# Patient Record
Sex: Female | Born: 1980 | Race: White | Hispanic: No | Marital: Married | Smoking: Never smoker
Health system: Southern US, Community
[De-identification: ages and names within clinical notes are randomized; demographics above are authoritative.]

---

## 2021-01-12 ENCOUNTER — Emergency Department (HOSPITAL_COMMUNITY)
Admission: EM | Admit: 2021-01-12 | Discharge: 2021-01-12 | Disposition: A | Discharge status: Home / Self Care | Payer: BC Managed Care – PPO | Attending: Emergency Medicine | Admitting: Emergency Medicine

## 2021-01-12 ENCOUNTER — Other Ambulatory Visit: Payer: Self-pay

## 2021-01-12 ENCOUNTER — Emergency Department (HOSPITAL_COMMUNITY): Payer: BC Managed Care – PPO

## 2021-01-12 ENCOUNTER — Encounter (HOSPITAL_COMMUNITY): Payer: Self-pay | Admitting: Emergency Medicine

## 2021-01-12 DIAGNOSIS — R079 Chest pain, unspecified: Secondary | ICD-10-CM | POA: Diagnosis not present

## 2021-01-12 DIAGNOSIS — R110 Nausea: Secondary | ICD-10-CM | POA: Diagnosis not present

## 2021-01-12 DIAGNOSIS — R0789 Other chest pain: Secondary | ICD-10-CM | POA: Insufficient documentation

## 2021-01-12 LAB — CBC WITH DIFFERENTIAL/PLATELET
Abs Immature Granulocytes: 0.03 10*3/uL (ref 0.00–0.07)
Basophils Absolute: 0 10*3/uL (ref 0.0–0.1)
Basophils Relative: 1 %
Eosinophils Absolute: 0.1 10*3/uL (ref 0.0–0.5)
Eosinophils Relative: 2 %
HCT: 40.8 % (ref 36.0–46.0)
Hemoglobin: 13.6 g/dL (ref 12.0–15.0)
Immature Granulocytes: 1 %
Lymphocytes Relative: 37 %
Lymphs Abs: 2.4 10*3/uL (ref 0.7–4.0)
MCH: 30.3 pg (ref 26.0–34.0)
MCHC: 33.3 g/dL (ref 30.0–36.0)
MCV: 90.9 fL (ref 80.0–100.0)
Monocytes Absolute: 0.6 10*3/uL (ref 0.1–1.0)
Monocytes Relative: 9 %
Neutro Abs: 3.4 10*3/uL (ref 1.7–7.7)
Neutrophils Relative %: 50 %
Platelets: 288 10*3/uL (ref 150–400)
RBC: 4.49 MIL/uL (ref 3.87–5.11)
RDW: 12.3 % (ref 11.5–15.5)
WBC: 6.5 10*3/uL (ref 4.0–10.5)
nRBC: 0 % (ref 0.0–0.2)

## 2021-01-12 LAB — COMPREHENSIVE METABOLIC PANEL
ALT: 20 U/L (ref 0–44)
AST: 19 U/L (ref 15–41)
Albumin: 4.3 g/dL (ref 3.5–5.0)
Alkaline Phosphatase: 44 U/L (ref 38–126)
Anion gap: 9 (ref 5–15)
BUN: 10 mg/dL (ref 6–20)
CO2: 24 mmol/L (ref 22–32)
Calcium: 9.2 mg/dL (ref 8.9–10.3)
Chloride: 104 mmol/L (ref 98–111)
Creatinine, Ser: 0.65 mg/dL (ref 0.44–1.00)
GFR, Estimated: >60 mL/min (ref >60)
Glucose, Bld: 128 mg/dL — ABNORMAL HIGH (ref 70–99)
Potassium: 3.7 mmol/L (ref 3.5–5.1)
Sodium: 137 mmol/L (ref 135–145)
Total Bilirubin: 0.6 mg/dL (ref 0.3–1.2)
Total Protein: 7 g/dL (ref 6.5–8.1)

## 2021-01-12 LAB — I-STAT BETA HCG BLOOD, ED (MC, WL, AP ONLY): I-stat hCG, quantitative: <5 m[IU]/mL (ref <5)

## 2021-01-12 LAB — LIPASE, BLOOD: Lipase: 37 U/L (ref 11–51)

## 2021-01-12 LAB — TROPONIN I (HIGH SENSITIVITY)
Troponin I (High Sensitivity): <2 ng/L (ref <18)
Troponin I (High Sensitivity): <2 ng/L (ref <18)

## 2021-01-12 MED ORDER — ALUM & MAG HYDROXIDE-SIMETH 200-200-20 MG/5ML PO SUSP
30.0000 mL | Freq: Once | ORAL | Status: AC
  Administered 2021-01-12: 30 mL via ORAL
  Filled 2021-01-12: qty 30

## 2021-01-12 MED ORDER — LIDOCAINE VISCOUS HCL 2 % MT SOLN
15.0000 mL | Freq: Once | OROMUCOSAL | Status: AC
  Administered 2021-01-12: 15 mL via ORAL
  Filled 2021-01-12: qty 15

## 2021-01-12 NOTE — ED Provider Notes (Signed)
[facility] EMERGENCY DEPARTMENT Provider Note   CSN: [id number] Arrival date & time: 01/12/21  1502     History Chief Complaint  Patient presents with   Chest Pain    Bonnie Mendoza is a 40 y.o. female.  Patient presents with a chief complaint of chest pain.  She says that this started this afternoon while she was at work.  It is her midsternal area.  Does not radiate.  The pain is intermittent.  It lasts for couple minutes and then goes away.  She states that it feels like it is burning and pressure.  Pain is not associated with exertion.  She says she has had about 5 episodes of this pain.  Never had this pain before.  He is currently asymptomatic She has had some associated nausea but no vomiting. She is currently asymptomatic.  Denies any shortness of breath, vision changes, abdominal pain, numbness or weakness in extremities.  Chest Pain Associated symptoms: nausea   Associated symptoms: no abdominal pain, no back pain, no cough, no dizziness, no fever, no headache, no numbness, no palpitations, no shortness of breath, no vomiting and no weakness       History reviewed. No pertinent past medical history.  There are no problems to display for this patient.   History reviewed. No pertinent surgical history.   OB History   No obstetric history on file.     History reviewed. No pertinent family history.  Social History   Tobacco Use   Smoking status: Never   Smokeless tobacco: Never  Substance Use Topics   Alcohol use: Not Currently   Drug use: Never    Home Medications Prior to Admission medications   Not on File    Allergies    Penicillins  Review of Systems   Review of Systems  Constitutional:  Negative for chills and fever.  HENT:  Negative for congestion, rhinorrhea and sore throat.   Eyes:  Negative for visual disturbance.  Respiratory:  Negative for cough, chest tightness and shortness of breath.   Cardiovascular:  Positive for  chest pain. Negative for palpitations and leg swelling.  Gastrointestinal:  Positive for nausea. Negative for abdominal pain, blood in stool, constipation, diarrhea and vomiting.  Genitourinary:  Negative for dysuria, flank pain and hematuria.  Musculoskeletal:  Negative for back pain.  Skin:  Negative for rash and wound.  Neurological:  Negative for dizziness, syncope, weakness, light-headedness, numbness and headaches.  Psychiatric/Behavioral:  Negative for confusion.   All other systems reviewed and are negative.  Physical Exam Updated Vital Signs BP 98/76   Pulse 74   Temp 98.2 F (36.8 C) (Oral)   Resp 17   Ht 5\' 5"  (1.651 m)   Wt 66.7 kg   LMP 01/01/2021 (Exact Date)   SpO2 100%   BMI 24.46 kg/m   Physical Exam Vitals and nursing note reviewed.  Constitutional:      General: She is not in acute distress.    Appearance: Normal appearance. She is not ill-appearing, toxic-appearing or diaphoretic.  HENT:     Head: Normocephalic and atraumatic.     Nose: No nasal deformity.     Mouth/Throat:     Lips: Pink. No lesions.     Mouth: Mucous membranes are dry. No injury, lacerations, oral lesions or angioedema.     Pharynx: Oropharynx is clear. Uvula midline. No pharyngeal swelling, oropharyngeal exudate, posterior oropharyngeal erythema or uvula swelling.  Eyes:     General: Gaze aligned  appropriately. No scleral icterus.       Right eye: No discharge.        Left eye: No discharge.     Conjunctiva/sclera: Conjunctivae normal.     Right eye: Right conjunctiva is not injected. No exudate or hemorrhage.    Left eye: Left conjunctiva is not injected. No exudate or hemorrhage.    Pupils: Pupils are equal, round, and reactive to light.  Cardiovascular:     Rate and Rhythm: Normal rate and regular rhythm.     Pulses: Normal pulses.          Radial pulses are 2+ on the right side and 2+ on the left side.       Dorsalis pedis pulses are 2+ on the right side and 2+ on the left  side.     Heart sounds: Normal heart sounds, S1 normal and S2 normal. Heart sounds not distant. No murmur heard.   No friction rub. No gallop. No S3 or S4 sounds.     Comments: Pain is not reproducible with palpation of her anterior chest.  Heart sounds with regular rate and rhythm.  No murmurs. Pulmonary:     Effort: Pulmonary effort is normal. No accessory muscle usage or respiratory distress.     Breath sounds: Normal breath sounds. No stridor. No wheezing, rhonchi or rales.  Chest:     Chest wall: No tenderness.  Abdominal:     General: Abdomen is flat. Bowel sounds are normal. There is no distension.     Palpations: Abdomen is soft. There is no mass or pulsatile mass.     Tenderness: There is no abdominal tenderness. There is no guarding or rebound.  Musculoskeletal:     Right lower leg: No edema.     Left lower leg: No edema.  Skin:    General: Skin is warm and dry.     Coloration: Skin is not jaundiced or pale.     Findings: No bruising, erythema, lesion or rash.  Neurological:     General: No focal deficit present.     Mental Status: She is alert and oriented to person, place, and time.     GCS: GCS eye subscore is 4. GCS verbal subscore is 5. GCS motor subscore is 6.  Psychiatric:        Mood and Affect: Mood normal.        Behavior: Behavior normal. Behavior is cooperative.    ED Results / Procedures / Treatments   Labs (all labs ordered are listed, but only abnormal results are displayed) Labs Reviewed  COMPREHENSIVE METABOLIC PANEL - Abnormal; Notable for the following components:      Result Value   Glucose, Bld 128 (*)    All other components within normal limits  CBC WITH DIFFERENTIAL/PLATELET  LIPASE, BLOOD  I-STAT BETA HCG BLOOD, ED ([facility], [facility], [facility] ONLY)  TROPONIN I (HIGH SENSITIVITY)  TROPONIN I (HIGH SENSITIVITY)    EKG None  Radiology DG Chest 2 View  Result Date: 01/12/2021 CLINICAL DATA:  Chest pain, burning sensation for 30 minutes EXAM: CHEST - 2  VIEW COMPARISON:  None. FINDINGS: The heart size and mediastinal contours are within normal limits. Both lungs are clear. The visualized skeletal structures are unremarkable. IMPRESSION: No active cardiopulmonary disease. Electronically Signed   By: Sharlet Salina M.D.   On: 01/12/2021 15:54    Procedures Procedures   Medications Ordered in ED Medications  alum & mag hydroxide-simeth (MAALOX/MYLANTA) 200-200-20 MG/5ML suspension 30 mL (30  mLs Oral Given 01/12/21 1809)    And  lidocaine (XYLOCAINE) 2 % viscous mouth solution 15 mL (15 mLs Oral Given 01/12/21 1809)    ED Course  I have reviewed the triage vital signs and the nursing notes.  Pertinent labs & imaging results that were available during my care of the patient were reviewed by me and considered in my medical decision making (see chart for details).    MDM Rules/Calculators/A&P                         This is a well-appearing 40 year old female with no notable past medical history who presents to the emergency department with 4-5 hours of new onset intermittent chest pains that are not associated with exertion. She is afebrile and vitals are stable. Her exam is completely unremarkable.  She appears to be in no acute distress.  She is currently asymptomatic.  CBC with no leukocytosis or anemia.  CMP with no electrolyte abnormalities or kidney dysfunction.  No metabolic abnormalities.  Pregnancy test negative.  Initial troponin is less than 2. Second troponin negative. CXR with no acute abnormalities EKG is NSR. Added on a lipase. Lipase Negative.   Treated sx with GI cocktail  On reevaluation, patient is feeling better. No episodes of chest pain while in ED. No evidence of ACS or other emergent cause of chest pain found on labs. I have low suspicion for PE so will not do further evaluation. All workup is benign. Patient is to follow up with PCP or [facility] clinic.   I discussed this case with Dr. Stevie Kern who  agrees with the plan outlined above.    Final Clinical Impression(s) / ED Diagnoses Final diagnoses:  Nonspecific chest pain    Rx / DC Orders ED Discharge Orders     None        Therese Sarah 01/12/21 Allegra Grana, MD 01/12/21 2356

## 2021-01-12 NOTE — ED Triage Notes (Signed)
Pt here for cp that started approx 30 min ago, pt reports intermittent central cp w/ shob and nausea, no radiation, no pmh.

## 2021-01-12 NOTE — ED Provider Notes (Signed)
Emergency Medicine Provider Triage Evaluation Note  Bonnie Mendoza , a 40 y.o. female  was evaluated in triage.  Pt complains of chest pain.  Feels like pressure, substernal location.  Does not radiate.  Associated with nausea, no vomiting.  States she felt short of breath when it initially started.  Comes and goes, started acutely 30 minutes ago.  No cardiac history, not on any blood thinners, no recent travel, surgeries not on oral birth control, does not smoke cigarettes, not hypertension, not diabetic.Marland Kitchen  Review of Systems  Positive: Chest pain, nausea, shortness of breath Negative: Vomiting, back pain, arm pain  Physical Exam  There were no vitals taken for this visit. Gen:   Awake, no distress.  Tearful Resp:  Normal effort  MSK:   Moves extremities without difficulty  Other:  Tachycardic, radial pulses equal bilaterally.  Medical Decision Making  Medically screening exam initiated at 3:09 PM.  Appropriate orders placed.  Bonnie Mendoza was informed that the remainder of the evaluation will be completed by another provider, this initial triage assessment does not replace that evaluation, and the importance of remaining in the ED until their evaluation is complete.  Chest pain work-up.   Theron Arista, PA-C 01/12/21 1510    Jacalyn Lefevre, MD 01/12/21 [id number]

## 2021-01-12 NOTE — Discharge Instructions (Addendum)
You were seen in the ED for chest pain. Your work up was not concerning for any emergent cause of chest pain. Please follow up with your PCP or contact [facility] for further evaluation if you continue to experience these pains.

## 2021-01-17 DIAGNOSIS — R079 Chest pain, unspecified: Secondary | ICD-10-CM | POA: Diagnosis not present

## 2021-02-07 DIAGNOSIS — Z131 Encounter for screening for diabetes mellitus: Secondary | ICD-10-CM | POA: Diagnosis not present

## 2021-02-07 DIAGNOSIS — H1013 Acute atopic conjunctivitis, bilateral: Secondary | ICD-10-CM | POA: Diagnosis not present

## 2021-02-07 DIAGNOSIS — R600 Localized edema: Secondary | ICD-10-CM | POA: Diagnosis not present

## 2021-02-07 DIAGNOSIS — Z13220 Encounter for screening for lipoid disorders: Secondary | ICD-10-CM | POA: Diagnosis not present

## 2021-02-07 DIAGNOSIS — Z0000 Encounter for general adult medical examination without abnormal findings: Secondary | ICD-10-CM | POA: Diagnosis not present

## 2021-02-16 DIAGNOSIS — H11151 Pinguecula, right eye: Secondary | ICD-10-CM | POA: Diagnosis not present

## 2021-03-08 ENCOUNTER — Other Ambulatory Visit: Payer: Self-pay

## 2021-03-08 ENCOUNTER — Ambulatory Visit (INDEPENDENT_AMBULATORY_CARE_PROVIDER_SITE_OTHER): Payer: BC Managed Care – PPO | Admitting: Allergy

## 2021-03-08 ENCOUNTER — Encounter: Payer: Self-pay | Admitting: Allergy

## 2021-03-08 VITALS — BP 106/68 | HR 68 | Resp 12 | Ht 64.5 in | Wt 151.0 lb

## 2021-03-08 DIAGNOSIS — T783XXA Angioneurotic edema, initial encounter: Secondary | ICD-10-CM

## 2021-03-08 DIAGNOSIS — J3089 Other allergic rhinitis: Secondary | ICD-10-CM | POA: Diagnosis not present

## 2021-03-08 DIAGNOSIS — T783XXD Angioneurotic edema, subsequent encounter: Secondary | ICD-10-CM

## 2021-03-08 DIAGNOSIS — R600 Localized edema: Secondary | ICD-10-CM | POA: Diagnosis not present

## 2021-03-08 DIAGNOSIS — H5789 Other specified disorders of eye and adnexa: Secondary | ICD-10-CM

## 2021-03-08 NOTE — Progress Notes (Signed)
New Patient Note  RE: Bonnie Mendoza MRN: [id number] DOB: [birth year] Date of Office Visit: 03/08/2021  Consult requested by: Genia Hotter, FNP Primary care provider: Genia Hotter, FNP  Chief Complaint: Facial Swelling (Eye swelling- started 3 years ago )  History of Present Illness: I had the pleasure of seeing Nadia Viar for initial evaluation at the [facility] on 03/08/2021. She is a 40 y.o. female, who is referred here by Stamey, Verda Cumins, FNP for the evaluation of periorbital edema.  Swelling started about 3 years ago. Mainly occurs around her eyes only.  Describes them as she feels white bubbles and within seconds the eyes can get swollen shut. Individual swelling episodes last about 1 week. Associated symptoms include: none.  Denies itching beforehand sometimes it gets itchy afterwards. Frequency of episodes: varies - can occur once a month to every few months. Last episode was in the summer. This occurred overnight and she is not sure if she touched something then rubbed her eyes which caused them to swell. This can occur any time of the year.  She is concerned about this occurring while she's at work or driving her children.   Suspected triggers are unknown. Denies any fevers, chills, changes in medications, foods, personal care products or recent infections.  Patient wears make up and has not changed her make up products. Does not wear contacts.   She has tried the following therapies: eye drops to get the red out with good benefit.  Tried benadryl and pataday.  She has been using the red eye drops more frequently since she is working in person and is self-conscious about her red eyes as some people made comments about them.   Systemic steroids yes. Currently on no daily medications.  Previous work up includes: 01/12/2021 - CBC diff and CMP, slightly elevated glucose Previous history of swelling: no. Family history of angioedema: no. Patient  is up to date with the following cancer screening tests: physical exam, pap smear. Ace-inhibitor use: no  Reviewed images on the phone - significant periorbital edema b/l. No erythema.   Assessment and Plan: Radiance is a 40 y.o. female with: Periorbital edema of both eyes Significant periorbital edema started 3 years ago.  Occurs handful times a year with no specific triggers noted.  Concerned about allergies.  Episodes had a quick onset and last about 1 week.  No significant pruritus associated with this. No family history of angioedema. Today's skin prick testing showed: Positive to dust mites and cockroach Borderline to shellfish. Will get bloodwork instead of intradermal testing. Tolerates shellfish with no issues - okay to consume.  Discussed that I'm not sure the above allergens are causing her symptoms as it's occurring on such a sporadic basis. Keep track of episodes and take pictures.  Get bloodwork as below. If bloodwork is unremarkable will recommend true patch testing next.   Other allergic rhinitis Denies any significant rhinitis symptoms. Today's skin prick testing showed: Positive to dust mites and cockroach Start environmental control measures as below. Use over the counter antihistamines such as Zyrtec (cetirizine), Claritin (loratadine), Allegra (fexofenadine), or Xyzal (levocetirizine) daily as needed.   Eye redness Noted some erythema of her conjunctiva and has been using OTC red eye drops with good benefit. No recent eye exam. Recommend getting eye exam. Use refresh eye drops as needed - try the preservative free ones.  Try to wean off the eye drops that get the red out. Stop using pataday.  Return in about 4 months (around 07/07/2021).  No orders of the defined types were placed in this encounter.  Lab Orders         C1 esterase inhibitor, functional         C1 Esterase Inhibitor         C3 and C4         C-reactive protein         Allergens w/Total IgE Area 2          Tryptase         ANA w/Reflex         Sedimentation rate         Chronic Urticaria         Thyroid Cascade Profile      Other allergy screening: Asthma: no Rhino conjunctivitis: no Food allergy: no Medication allergy: yes Hymenoptera allergy: no Urticaria: no Eczema:no History of recurrent infections suggestive of immunodeficency: no  Diagnostics: Skin Testing: Environmental allergy panel and select foods. Positive to dust mites and cockroach Borderline to shellfish - okay to continue to eat.  Results discussed with patient/family.  Airborne Adult Perc - 03/08/21 1049     Time Antigen Placed 1049    Allergen Manufacturer Lavella Hammock    Location Back    Number of Test 59    1. Control-Buffer 50% Glycerol Negative    2. Control-Histamine 1 mg/ml 2+    3. Albumin saline Negative    4. [place] Negative    5. [place] Negative    6. Johnson Negative    7. [place] Blue Negative    8. Meadow Fescue Negative    9. Perennial Rye Negative    10. Sweet Vernal Negative    11. Timothy Negative    12. Cocklebur Negative    13. Burweed Marshelder Negative    14. Ragweed, short Negative    15. Ragweed, Giant Negative    16. Plantain,  English Negative    17. Lamb's Quarters Negative    18. Sheep Sorrell Negative    19. Rough Pigweed Negative    20. Marsh Elder, Rough Negative    21. Mugwort, Common Negative    22. Ash mix Negative    23. Birch mix Negative    24. Beech American Negative    25. Box, Elder Negative    26. Cedar, red Negative    27. Cottonwood, [place] Negative    28. Elm mix Negative    29. Hickory Negative    30. Maple mix Negative    31. Oak, [place] mix Negative    32. Pecan Pollen Negative    33. Pine mix Negative    34. Sycamore Eastern Negative    35. Fair Oaks, Black Pollen Negative    36. Alternaria alternata Negative    37. Cladosporium Herbarum Negative    38. Aspergillus mix Negative    39. Penicillium mix Negative    40. Bipolaris  sorokiniana (Helminthosporium) Negative    41. Drechslera spicifera (Curvularia) Negative    42. Mucor plumbeus Negative    43. Fusarium moniliforme Negative    44. Aureobasidium pullulans (pullulara) Negative    45. Rhizopus oryzae Negative    46. Botrytis cinera Negative    47. Epicoccum nigrum Negative    48. Phoma betae Negative    49. Candida Albicans Negative    50. Trichophyton mentagrophytes Negative    51. Mite, D Farinae  5,000 AU/ml 2+    52. Mite, D Pteronyssinus  5,000  AU/ml Negative    53. Cat Hair 10,000 BAU/ml Negative    54.  Dog Epithelia Negative    55. Mixed Feathers Negative    56. Horse Epithelia Negative    57. Cockroach, German 2+    58. Mouse Negative    59. Tobacco Leaf Negative             Food Perc - 03/08/21 1049       Test Information   Time Antigen Placed 1049    Allergen Manufacturer Lavella Hammock    Location Back    Number of allergen test 10      Food   1. Peanut Negative    2. Soybean food Negative    3. Wheat, whole Negative    4. Sesame Negative    5. Milk, cow Negative    6. Egg White, chicken Negative    7. Casein Negative    8. Shellfish mix --   +/-   9. Fish mix Negative    10. Cashew Negative             Past Medical History: Patient Active Problem List   Diagnosis Date Noted   Periorbital edema of both eyes 03/08/2021   Angio-edema 03/08/2021   Eye redness 03/08/2021   Other allergic rhinitis 03/08/2021    History reviewed. No pertinent past medical history. Past Surgical History: History reviewed. No pertinent surgical history. Medication List:  Current Outpatient Medications  Medication Sig Dispense Refill   diphenhydrAMINE (BENADRYL) 25 mg capsule Take 25 mg by mouth every 6 (six) hours as needed.     tobramycin-dexamethasone (TOBRADEX) ophthalmic ointment 1 application 3 (three) times daily.     tobramycin-dexamethasone (TOBRADEX) ophthalmic solution every 4 (four) hours while awake.     No current  facility-administered medications for this visit.   Allergies: Allergies  Allergen Reactions   Penicillins Anaphylaxis   Social History: Social History   Socioeconomic History   Marital status: Married    Spouse name: Not on file   Number of children: Not on file   Years of education: Not on file   Highest education level: Not on file  Occupational History   Not on file  Tobacco Use   Smoking status: Never   Smokeless tobacco: Never  Vaping Use   Vaping Use: Never used  Substance and Sexual Activity   Alcohol use: Not Currently   Drug use: Never   Sexual activity: Not on file  Other Topics Concern   Not on file  Social History Narrative   Not on file   Social Determinants of Health   Financial Resource Strain: Not on file  Food Insecurity: Not on file  Transportation Needs: Not on file  Physical Activity: Not on file  Stress: Not on file  Social Connections: Not on file   Lives in a house. Smoking: denies Occupation: Microbiologist HistoryFreight forwarder in the house: no Charity fundraiser in the family room: no Carpet in the bedroom: no Heating: gas Cooling: central Pet: no  Family History: Family History  Problem Relation Age of Onset   Allergic rhinitis Father    Eczema Daughter    Eczema Son    Eczema Son    Asthma Neg Hx    Immunodeficiency Neg Hx    Atopy Neg Hx    Urticaria Neg Hx    Angioedema Neg Hx    Review of Systems  Constitutional:  Negative for appetite change, chills, fever and  unexpected weight change.  HENT:  Negative for congestion and rhinorrhea.   Eyes:  Positive for redness. Negative for itching.  Respiratory:  Negative for cough, chest tightness, shortness of breath and wheezing.   Cardiovascular:  Negative for chest pain.  Gastrointestinal:  Negative for abdominal pain.  Genitourinary:  Negative for difficulty urinating.  Skin:  Negative for rash.  Allergic/Immunologic: Positive for  environmental allergies.  Neurological:  Negative for headaches.   Objective: BP 106/68   Pulse 68   Resp 12   Ht 5' 4.5" (1.638 m)   Wt 151 lb (68.5 kg)   SpO2 98%   BMI 25.52 kg/m  Body mass index is 25.52 kg/m. Physical Exam Vitals and nursing note reviewed.  Constitutional:      Appearance: Normal appearance. She is well-developed.  HENT:     Head: Normocephalic and atraumatic.     Right Ear: Tympanic membrane and external ear normal.     Left Ear: Tympanic membrane and external ear normal.     Nose: Nose normal.     Mouth/Throat:     Mouth: Mucous membranes are moist.     Pharynx: Oropharynx is clear.  Eyes:     Comments: Slightly erythematous conjunctiva b/l.  Cardiovascular:     Rate and Rhythm: Normal rate and regular rhythm.     Heart sounds: Normal heart sounds. No murmur heard.   No friction rub. No gallop.  Pulmonary:     Effort: Pulmonary effort is normal.     Breath sounds: Normal breath sounds. No wheezing, rhonchi or rales.  Musculoskeletal:     Cervical back: Neck supple.  Skin:    General: Skin is warm.     Findings: No rash.  Neurological:     Mental Status: She is alert and oriented to person, place, and time.  Psychiatric:        Behavior: Behavior normal.  The plan was reviewed with the patient/family, and all questions/concerned were addressed.  It was my pleasure to see Fallon today and participate in her care. Please feel free to contact me with any questions or concerns.  Sincerely,  Rexene Alberts, DO Allergy & Immunology  [facility] office: [place] office: [phone]

## 2021-03-08 NOTE — Assessment & Plan Note (Signed)
Denies any significant rhinitis symptoms.  Today's skin prick testing showed: Positive to dust mites and cockroach  Start environmental control measures as below.  Use over the counter antihistamines such as Zyrtec (cetirizine), Claritin (loratadine), Allegra (fexofenadine), or Xyzal (levocetirizine) daily as needed.

## 2021-03-08 NOTE — Assessment & Plan Note (Signed)
Significant periorbital edema started 3 years ago.  Occurs handful times a year with no specific triggers noted.  Concerned about allergies.  Episodes had a quick onset and last about 1 week.  No significant pruritus associated with this. No family history of angioedema.  Today's skin prick testing showed: Positive to dust mites and cockroach Borderline to shellfish.  Will get bloodwork instead of intradermal testing.  Tolerates shellfish with no issues - okay to consume.   Discussed that I'm not sure the above allergens are causing her symptoms as it's occurring on such a sporadic basis. Bonnie Mendoza Keep track of episodes and take pictures.  . Get bloodwork as below. o If bloodwork is unremarkable will recommend true patch testing next.

## 2021-03-08 NOTE — Assessment & Plan Note (Signed)
Noted some erythema of her conjunctiva and has been using OTC red eye drops with good benefit. No recent eye exam. . Recommend getting eye exam. . Use refresh eye drops as needed - try the preservative free ones.  . Try to wean off the eye drops that get the red out. . Stop using pataday.

## 2021-03-08 NOTE — Patient Instructions (Addendum)
Today's skin testing showed: Positive to dust mites and cockroach Borderline to shellfish - okay to continue to eat.  Results given.   Swelling: Keep track of episodes and take pictures.  Get bloodwork We are ordering labs, so please allow 1-2 weeks for the results to come back. With the newly implemented Cures Act, the labs might be visible to you at the same time that they become visible to me. However, I will not address the results until all of the results are back, so please be patient.  In the meantime, continue recommendations in your patient instructions, including avoidance measures (if applicable), until you hear from me.  Red eyes: Recommend getting your eyes checked.  Use refresh eye drops as needed - try the preservative free ones.  Try to wean off the eye drops that get the red out. Stop using pataday.   Environmental allergies Start environmental control measures as below. Use over the counter antihistamines such as Zyrtec (cetirizine), Claritin (loratadine), Allegra (fexofenadine), or Xyzal (levocetirizine) daily as needed.   Consider patch testing in future if bloodwork is negative.  Patches are best placed on Monday with return to office on Wednesday and Friday of same week for readings.  Patches once placed should not get wet.  You do not have to stop any medications for patch testing but should not be on oral prednisone. You can schedule a patch testing visit when convenient for your schedule.    Follow up in 4 months or sooner if needed.    Control of House Dust Mite Allergen Dust mite allergens are a common trigger of allergy and asthma symptoms. While they can be found throughout the house, these microscopic creatures thrive in warm, humid environments such as bedding, upholstered furniture and carpeting. Because so much time is spent in the bedroom, it is essential to reduce mite levels there.  Encase pillows, mattresses, and box springs in special allergen-proof  fabric covers or airtight, zippered plastic covers.  Bedding should be washed weekly in hot water (130 F) and dried in a hot dryer. Allergen-proof covers are available for comforters and pillows that can't be regularly washed.  Wash the allergy-proof covers every few months. Minimize clutter in the bedroom. Keep pets out of the bedroom.  Keep humidity less than 50% by using a dehumidifier or air conditioning. You can buy a humidity measuring device called a hygrometer to monitor this.  If possible, replace carpets with hardwood, linoleum, or washable area rugs. If that's not possible, vacuum frequently with a vacuum that has a HEPA filter. Remove all upholstered furniture and non-washable window drapes from the bedroom. Remove all non-washable stuffed toys from the bedroom.  Wash stuffed toys weekly. Cockroach Allergen Avoidance Cockroaches are often found in the homes of densely populated urban areas, schools or commercial buildings, but these creatures can lurk almost anywhere. This does not mean that you have a dirty house or living area. Block all areas where roaches can enter the home. This includes crevices, wall cracks and windows.  Cockroaches need water to survive, so fix and seal all leaky faucets and pipes. Have an exterminator go through the house when your family and pets are gone to eliminate any remaining roaches. Keep food in lidded containers and put pet food dishes away after your pets are done eating. Vacuum and sweep the floor after meals, and take out garbage and recyclables. Use lidded garbage containers in the kitchen. Wash dishes immediately after use and clean under stoves, refrigerators or  toasters where crumbs can accumulate. Wipe off the stove and other kitchen surfaces and cupboards regularly.

## 2021-03-16 LAB — C1 ESTERASE INHIBITOR, FUNCTIONAL: C1INH Functional/C1INH Total MFr SerPl: 82 %mean normal

## 2021-03-16 LAB — ALLERGENS W/TOTAL IGE AREA 2
Alternaria Alternata IgE: <0.1 kU/L
Aspergillus Fumigatus IgE: <0.1 kU/L
Bermuda Grass IgE: <0.1 kU/L
Cat Dander IgE: <0.1 kU/L
Cedar, Mountain IgE: <0.1 kU/L
Cladosporium Herbarum IgE: <0.1 kU/L
Cockroach, German IgE: 1.37 kU/L — AB
Common Silver Birch IgE: <0.1 kU/L
Cottonwood IgE: <0.1 kU/L
D Farinae IgE: <0.1 kU/L
D Pteronyssinus IgE: <0.1 kU/L
Dog Dander IgE: <0.1 kU/L
Elm, American IgE: <0.1 kU/L
IgE (Immunoglobulin E), Serum: 112 IU/mL (ref 6–495)
Johnson Grass IgE: <0.1 kU/L
Maple/Box Elder IgE: <0.1 kU/L
Mouse Urine IgE: <0.1 kU/L
Oak, White IgE: <0.1 kU/L
Pecan, Hickory IgE: <0.1 kU/L
Penicillium Chrysogen IgE: <0.1 kU/L
Pigweed, Rough IgE: <0.1 kU/L
Ragweed, Short IgE: 0.28 kU/L — AB
Sheep Sorrel IgE Qn: <0.1 kU/L
Timothy Grass IgE: <0.1 kU/L
White Mulberry IgE: <0.1 kU/L

## 2021-03-16 LAB — C3 AND C4
Complement C3, Serum: 104 mg/dL (ref 82–167)
Complement C4, Serum: 21 mg/dL (ref 12–38)

## 2021-03-16 LAB — CHRONIC URTICARIA: cu index: <3.2 (ref <10)

## 2021-03-16 LAB — ANA W/REFLEX: Anti Nuclear Antibody (ANA): NEGATIVE

## 2021-03-16 LAB — C1 ESTERASE INHIBITOR: C1INH SerPl-mCnc: 28 mg/dL (ref 21–39)

## 2021-03-16 LAB — C-REACTIVE PROTEIN: CRP: <1 mg/L (ref 0–10)

## 2021-03-16 LAB — TRYPTASE: Tryptase: <1 ug/L — ABNORMAL LOW (ref 2.2–13.2)

## 2021-03-16 LAB — SEDIMENTATION RATE: Sed Rate: 2 mm/hr (ref 0–32)

## 2021-03-16 LAB — THYROID CASCADE PROFILE: TSH: 2.09 u[IU]/mL (ref 0.450–4.500)

## 2021-04-11 ENCOUNTER — Other Ambulatory Visit: Payer: Self-pay

## 2021-04-11 ENCOUNTER — Ambulatory Visit (INDEPENDENT_AMBULATORY_CARE_PROVIDER_SITE_OTHER): Payer: BC Managed Care – PPO | Admitting: Internal Medicine

## 2021-04-11 DIAGNOSIS — L235 Allergic contact dermatitis due to other chemical products: Secondary | ICD-10-CM | POA: Diagnosis not present

## 2021-04-11 DIAGNOSIS — J3089 Other allergic rhinitis: Secondary | ICD-10-CM

## 2021-04-11 DIAGNOSIS — R600 Localized edema: Secondary | ICD-10-CM

## 2021-04-11 NOTE — Progress Notes (Signed)
FOLLOW UP Date of Service/Encounter:  04/11/21   Subjective:  Bonnie Mendoza (DOB: 13-Feb-1981) is a 41 y.o. female who returns to the [facility] on 04/11/2021 in re-evaluation of the following: Periorbital edema and patch test placement History obtained from: chart review and patient.  For Review, LV was on 03-08-2021 with Dr. Maudie Mercury seen for periorbital edema.    Today is a  follow-up for patch test placement .  At initial visit patient described 3-year history of periorbital edema associated with "white bubbles which appear in her skin .  "No clear triggers are identified.  Frequency was variable.  Previous treatments include eyedrops for conjunctival hyperemia with good benefit.  She had also tried Benadryl and Pataday.  Previous work up includes: 01/12/2021 - CBC diff and CMP, slightly elevated glucose, 03-08-2021-tryptase, ANA, CRP, ESR, chronic urticaria index, C3-C4, C1 esterase inhibitor level and function were all normal Previous history of swelling: no. Family history of angioedema: no. Patient is up to date with the following cancer screening tests: physical exam, pap smear. Ace-inhibitor use: no Skin prick testing: Positive to dust mite and roach, borderline to shellfish.  She tolerates shellfish with no issues and was instructed to continue consumption.  She reports today noticing association with symptoms and use of any product containing Vaseline.  This exposure does not account for 100% of flares but a majority.  Reviewed protocol for patch testing to include avoidance of showering and need to return on a Wednesday and Friday for delayed reads.  Reviewed risk of sensitization through patch testing and recurrence of original dermatitis.  Allergies as of 04/11/2021       Reactions   Penicillins Anaphylaxis        Medication List        Accurate as of April 11, 2021  8:41 AM. If you have any questions, ask your nurse or doctor.          diphenhydrAMINE  25 mg capsule Commonly known as: BENADRYL Take 25 mg by mouth every 6 (six) hours as needed.   tobramycin-dexamethasone ophthalmic ointment Commonly known as: TOBRADEX 1 application 3 (three) times daily.   tobramycin-dexamethasone ophthalmic solution Commonly known as: TOBRADEX every 4 (four) hours while awake.       No past medical history on file. No past surgical history on file. Otherwise, there have been no changes to her past medical history, surgical history, family history, or social history.  ROS: All others negative except as noted per HPI.   Objective:  There were no vitals taken for this visit. There is no height or weight on file to calculate BMI. Physical Exam: General Appearance:  Alert, cooperative, no distress, appears stated age  Head:  Normocephalic, without obvious abnormality, atraumatic  HEENT  Conjunctiva clear, no dermatitis noted on eyelids         Neck: Supple, symmetrical  Lungs:   Respirations unlabored, no coughing,   Heart:  Appears well perfused,   Extremities: No edema  Skin: Skin color, texture, turgor normal, no rashes or lesions on visualized portions of skin  Neurologic: No gross deficits   Reviewed: Previous allergy encounters, testing, PFTS, allergy pertinent laboratory and radiographic data   I reviewed her past medical history, social history, family history, and environmental history and no significant changes have been reported from her previous visit.   Assessment:  Periorbital edema of both eyes  Other allergic rhinitis  Plan/Recommendations:   Patient Instructions  Periorbital edema of both  eyes True Test (36 allergens) placed today.  Patient to follow-up on Wednesday for initial read.  Instructed to avoid rubbing, showering or disrupting test.  Other allergic rhinitis Prior Skin Testing in 2022: Positive to dust mites and cockroach Continue environmental control measures as below. Continue Use over the counter  antihistamines such as Zyrtec (cetirizine), Claritin (loratadine), Allegra (fexofenadine), or Xyzal (levocetirizine) daily as needed.   Follow up: Wednesday 04/13/21 for initial patch test read   Thank you so much for letting me partake in your care today.  Don't hesitate to reach out if you have any additional concerns!  Roney Marion, MD  [facility] [place] [place]

## 2021-04-11 NOTE — Patient Instructions (Addendum)
Periorbital edema of both eyes True Test (36 allergens) placed today.  Patient to follow-up on Wednesday for initial read.  Instructed to avoid rubbing, showering or disrupting test.  Other allergic rhinitis Prior Skin Testing in 2022: Positive to dust mites and cockroach Continue environmental control measures as below. Continue Use over the counter antihistamines such as Zyrtec (cetirizine), Claritin (loratadine), Allegra (fexofenadine), or Xyzal (levocetirizine) daily as needed.   Follow up: Wednesday 04/13/21 for initial patch test read   Thank you so much for letting me partake in your care today.  Don't hesitate to reach out if you have any additional concerns!  Ferol Luz, MD  [facility] [place] [place]

## 2021-04-13 ENCOUNTER — Encounter: Payer: Self-pay | Admitting: Family Medicine

## 2021-04-13 ENCOUNTER — Other Ambulatory Visit: Payer: Self-pay

## 2021-04-13 ENCOUNTER — Ambulatory Visit: Payer: BC Managed Care – PPO | Admitting: Family Medicine

## 2021-04-13 DIAGNOSIS — L239 Allergic contact dermatitis, unspecified cause: Secondary | ICD-10-CM | POA: Insufficient documentation

## 2021-04-13 DIAGNOSIS — L235 Allergic contact dermatitis due to other chemical products: Secondary | ICD-10-CM

## 2021-04-13 NOTE — Progress Notes (Signed)
° ° °  Follow-up Note  RE: Bonnie Mendoza MRN: [id number] DOB: Nov 17, 1980 Date of Office Visit: 04/13/2021  Primary care provider: Scarlett Presto Verda Cumins, FNP Referring provider: Stamey, Verda Cumins, FNP   Bonnie Mendoza returns to the office today for the initial patch test interpretation, given suspected history of contact dermatitis.    Diagnostics:   TRUE TEST 48-hour hour reading:  possible reaction to #10 (Balsam of [place]), positive reaction to #23 (Thiomersal), possible reaction to #28 (Gold sodium thiosulfate), and positive reaction to #34 (Parthenolide)   Plan:   Allergic contact dermatitis - The patient has been provided detailed information regarding the substances she is sensitive to, as well as products containing the substances.   - Meticulous avoidance of these substances is recommended.  - If avoidance is not possible, the use of barrier creams or lotions is recommended. - If symptoms persist or progress despite meticulous avoidance of the substances as listed above, a dermatology referral may be warranted.  Thank you for the opportunity to care for this patient.  Please do not hesitate to contact me with questions.  Thermon Leyland, FNP [facility]

## 2021-04-13 NOTE — Patient Instructions (Signed)
Allergic contact dermatitis - The patient has been provided detailed information regarding the substances she is sensitive to, as well as products containing the substances.   - Meticulous avoidance of these substances is recommended.  - If avoidance is not possible, the use of barrier creams or lotions is recommended. - If symptoms persist or progress despite meticulous avoidance of the substances as listed below, a dermatology referral may be warranted.  Diagnostics:   TRUE TEST 48-hour hour reading: possible reaction to #10 (Balsam of [place]), positive reaction to #23 (Thiomersal), possible reaction to #28 (Gold sodium thiosulfate), and positive reaction to #34 (Parthenolide)  Call the clinic if this treatment plan is not working well for you  Follow up in 2 days or sooner if needed.

## 2021-04-15 ENCOUNTER — Encounter: Payer: BC Managed Care – PPO | Admitting: Family Medicine

## 2021-04-15 ENCOUNTER — Ambulatory Visit: Payer: BC Managed Care – PPO | Admitting: Family

## 2021-04-15 ENCOUNTER — Encounter: Payer: Self-pay | Admitting: Family

## 2021-04-15 ENCOUNTER — Other Ambulatory Visit: Payer: Self-pay

## 2021-04-15 VITALS — Temp 98.0°F

## 2021-04-15 DIAGNOSIS — L235 Allergic contact dermatitis due to other chemical products: Secondary | ICD-10-CM

## 2021-04-15 MED ORDER — CETIRIZINE HCL 10 MG PO TABS: 10.0000 mg | ORAL_TABLET | Freq: Every day | ORAL | 3 refills | Status: DC

## 2021-04-15 NOTE — Progress Notes (Signed)
Bonnie Mendoza returns to the office today for the final patch test interpretation, given suspected history of contact dermatitis. She reports that since the patch was placed on Monday she has had itchy areas that come and go above her eye and her upper eyelid was swollen earlier this morning. She does not take an antihistamine and reports this is different from her previous swelling that involved swelling around both her eyes. Her lab work on 03/08/21 for thyroid, autoimmune screener, inflammation markers, chronic urticaria index, tryptase, and angioedema panel were all normal.   Diagnostics:   TRUE TEST 96-hour hour reading:  (++) strong positive reaction to Nickel Sulfate, (+) weak positive reaction to Thimerosal, and (+) weak positive reaction to Parthenolide  Plan:   Allergic contact dermatitis - The patient has been provided detailed information regarding the substances she is sensitive to, as well as products containing the substances.   - Meticulous avoidance of these substances is recommended.  - If avoidance is not possible, the use of barrier creams or lotions is recommended. - If symptoms persist or progress despite meticulous avoidance of Nickel Sulfate, Balsam of [place], Thimerosal, Gold Sodium Thiosulfate, and Parthenolide, a Dermatology Referral may be warranted.  Nehemiah Settle, FNP [facility]

## 2021-04-15 NOTE — Patient Instructions (Addendum)
Allergic contact dermatitis - The patient has been provided detailed information regarding the substances she is sensitive to, as well as products containing the substances.   - Meticulous avoidance of these substances is recommended.  - If avoidance is not possible, the use of barrier creams or lotions is recommended. - If symptoms persist or progress despite meticulous avoidance of Nickel Sulfate,Balsam of [place], Thimerosal, Gold Sodium Thiosulfate, and Parthenolide a Dermatology Referral may be warranted. - May use Zyrtec (cetirizine) 10 mg once a day as needed for itching  Keep your already scheduled follow up appointment with Dr. Selena Batten on 07/07/21 @ 3:45 PM

## 2021-04-20 DIAGNOSIS — H11433 Conjunctival hyperemia, bilateral: Secondary | ICD-10-CM | POA: Diagnosis not present

## 2021-04-20 DIAGNOSIS — H1045 Other chronic allergic conjunctivitis: Secondary | ICD-10-CM | POA: Diagnosis not present

## 2021-04-20 DIAGNOSIS — H02882 Meibomian gland dysfunction right lower eyelid: Secondary | ICD-10-CM | POA: Diagnosis not present

## 2021-04-20 DIAGNOSIS — H02885 Meibomian gland dysfunction left lower eyelid: Secondary | ICD-10-CM | POA: Diagnosis not present

## 2021-05-06 DIAGNOSIS — H02882 Meibomian gland dysfunction right lower eyelid: Secondary | ICD-10-CM | POA: Diagnosis not present

## 2021-05-06 DIAGNOSIS — H02885 Meibomian gland dysfunction left lower eyelid: Secondary | ICD-10-CM | POA: Diagnosis not present

## 2021-07-06 NOTE — Progress Notes (Signed)
? ?Follow Up Note ? ?RE: Bonnie Mendoza MRN: [id number] DOB: [birth year] ?Date of Office Visit: 07/07/2021 ? ?Referring provider: Stamey, Verda Cumins, FNP ?Primary care provider: Stamey, Verda Cumins, FNP ? ?Chief Complaint: Allergic Rhinitis  ? ?History of Present Illness: ?I had the pleasure of seeing Bonnie Mendoza for a follow up visit at the [facility] on 07/07/2021. She is a 41 y.o. female, who is being followed for contact dermatitis and allergic rhinitis. Her previous allergy office visit was on 04/15/2021 with Nehemiah Settle FNP. Today is a regular follow up visit. ? ? Eye redness ?Saw the eye doctor and had normal eye exam and no dry eyes. ? ?Patient uses refresh eye drops  ?She did use steroid eye drops for a few weeks which actually worsened the redness. ? ?Uses lumify eye drops as needed - usually at work before meetings due to the extensive red eyes. It helps for a few hours.  ? ?She tried not to use anything over the weekend but it still gets red. ? ?Got glasses for blue light.  ? ?Allergic contact dermatitis ?Patient has not received a pdf of the safe list and she has been trying to avoid things that were positive on the true patch. ? ?Periorbital edema of both eyes ?No swelling of the eyes anymore.   ? ?Other allergic rhinitis ?Taking zyrtec 10mg  daily but not sure if effective.  ? ?Assessment and Plan: ?Bonnie Mendoza is a 41 y.o. female with: ?Eye redness ?Past history - Noted some erythema of her conjunctiva and has been using OTC red eye drops with good benefit.  ?Interim history - eye exam unremarkable. Tried steroid eye drops which made it worse. Using lumify eye drops wit good benefit.  ?Discussed with patient that I'm not sure if her environmental allergies are contributing to her red eyes or not.  ?Stop zyrtec as ineffective. ?Concerning if patient is using personal care products and eye drops that are not on the safe list - will e-mail new safe list to patient. ?Limit eye drop use - most  likely having some rebound symptoms. ?If no improvement, then will discuss allergy shots for environmental allergies.  ? ?Allergic contact dermatitis ?Past history - 2023 True patch test was positive to Balsam of 2024, Gold Sodium Thiosulfate, Nickel Sulfate, Parthenolide, Thimerosal ?Interim history - not really avoiding these items. ?Advised patient to look at safe list and only use personal care products from the safe list - pay attention to the eye drop section.  ? ?Perennial allergic rhinitis ?Past history - 2022 skin prick testing showed: Positive to dust mites and cockroach. 2022 bloodwork positive to cockroach and ragweed. ?Interim history - zyrtec ineffective.  ?Continue environmental control measures.  ?No symptoms besides the red eyes. ? ?Periorbital edema of both eyes ?Past history - Significant periorbital edema started 3 years ago.  Occurs handful times a year with no specific triggers noted.  Concerned about allergies.  Episodes had a quick onset and last about 1 week.  No significant pruritus associated with this. No family history of angioedema. ?Interim history - no additional episodes. Bloodwork unremarkable. ?Monitor symptoms.  ? ?Return in about 3 months (around 10/06/2021). ? ?No orders of the defined types were placed in this encounter. ? ?Lab Orders  ?No laboratory test(s) ordered today  ? ?Diagnostics: ?None.  ? ?Medication List:  ?No current outpatient medications on file.  ? ?No current facility-administered medications for this visit.  ? ?Allergies: ?Allergies  ?Allergen Reactions  ?  Penicillins Anaphylaxis  ? ?I reviewed her past medical history, social history, family history, and environmental history and no significant changes have been reported from her previous visit. ? ?Review of Systems  ?Constitutional:  Negative for appetite change, chills, fever and unexpected weight change.  ?HENT:  Negative for congestion and rhinorrhea.   ?Eyes:  Positive for redness. Negative for itching.   ?Respiratory:  Negative for cough, chest tightness, shortness of breath and wheezing.   ?Cardiovascular:  Negative for chest pain.  ?Gastrointestinal:  Negative for abdominal pain.  ?Genitourinary:  Negative for difficulty urinating.  ?Skin:  Negative for rash.  ?Allergic/Immunologic: Positive for environmental allergies.  ?Neurological:  Negative for headaches.  ? ?Objective: ?BP 100/66   Pulse 74   Temp 98.5 ?F (36.9 ?C) (Temporal)   Resp 16   SpO2 98%  ?There is no height or weight on file to calculate BMI. ?Physical Exam ?Vitals and nursing note reviewed.  ?Constitutional:   ?   Appearance: Normal appearance. She is well-developed.  ?HENT:  ?   Head: Normocephalic and atraumatic.  ?   Right Ear: Tympanic membrane and external ear normal.  ?   Left Ear: Tympanic membrane and external ear normal.  ?   Nose: Nose normal.  ?   Mouth/Throat:  ?   Mouth: Mucous membranes are moist.  ?   Pharynx: Oropharynx is clear.  ?Eyes:  ?   Comments: Slightly erythematous conjunctiva b/l.  ?Cardiovascular:  ?   Rate and Rhythm: Normal rate and regular rhythm.  ?   Heart sounds: Normal heart sounds. No murmur heard. ?  No friction rub. No gallop.  ?Pulmonary:  ?   Effort: Pulmonary effort is normal.  ?   Breath sounds: Normal breath sounds. No wheezing, rhonchi or rales.  ?Musculoskeletal:  ?   Cervical back: Neck supple.  ?Skin: ?   General: Skin is warm.  ?   Findings: No rash.  ?Neurological:  ?   Mental Status: She is alert and oriented to person, place, and time.  ?Psychiatric:     ?   Behavior: Behavior normal.  ? ?Previous notes and tests were reviewed. ?The plan was reviewed with the patient/family, and all questions/concerned were addressed. ? ?It was my pleasure to see Bonnie Mendoza today and participate in her care. Please feel free to contact me with any questions or concerns. ? ?Sincerely, ? ?Wyline Mood, DO ?Allergy & Immunology ? ?[facility] ?[place] office: [phone] ?[place]  office: [phone] ?

## 2021-07-07 ENCOUNTER — Ambulatory Visit (INDEPENDENT_AMBULATORY_CARE_PROVIDER_SITE_OTHER): Payer: BC Managed Care – PPO | Admitting: Allergy

## 2021-07-07 ENCOUNTER — Encounter: Payer: Self-pay | Admitting: Allergy

## 2021-07-07 VITALS — BP 100/66 | HR 74 | Temp 98.5°F | Resp 16

## 2021-07-07 DIAGNOSIS — J3089 Other allergic rhinitis: Secondary | ICD-10-CM | POA: Diagnosis not present

## 2021-07-07 DIAGNOSIS — L239 Allergic contact dermatitis, unspecified cause: Secondary | ICD-10-CM

## 2021-07-07 DIAGNOSIS — R600 Localized edema: Secondary | ICD-10-CM

## 2021-07-07 DIAGNOSIS — H5789 Other specified disorders of eye and adnexa: Secondary | ICD-10-CM

## 2021-07-07 NOTE — Patient Instructions (Addendum)
?  Make sure to only use things from the safe list - I will send the pdf later today. ?Limit eye drop use. ?Stop zyrtec as ineffective. ?If no improvement, then will discuss allergy shots for environmental allergies.  ? ?Follow up in 3 months or sooner if needed.  ? ? ?

## 2021-07-07 NOTE — Assessment & Plan Note (Signed)
Past history - 2023 True patch test was positive to Balsam of [place], Gold Sodium Thiosulfate, Nickel Sulfate, Parthenolide, Thimerosal ?Interim history - not really avoiding these items. ?? Advised patient to look at safe list and only use personal care products from the safe list - pay attention to the eye drop section.  ?

## 2021-07-07 NOTE — Assessment & Plan Note (Signed)
Past history - 2022 skin prick testing showed: Positive to dust mites and cockroach. 2022 bloodwork positive to cockroach and ragweed. ?Interim history - zyrtec ineffective.  ?? Continue environmental control measures.  ?? No symptoms besides the red eyes. ?

## 2021-07-07 NOTE — Assessment & Plan Note (Signed)
Past history - Noted some erythema of her conjunctiva and has been using OTC red eye drops with good benefit.  ?Interim history - eye exam unremarkable. Tried steroid eye drops which made it worse. Using lumify eye drops wit good benefit.  ?? Discussed with patient that I'm not sure if her environmental allergies are contributing to her red eyes or not.  ?? Stop zyrtec as ineffective. ?? Concerning if patient is using personal care products and eye drops that are not on the safe list - will e-mail new safe list to patient. ?? Limit eye drop use - most likely having some rebound symptoms. ?? If no improvement, then will discuss allergy shots for environmental allergies.  ?

## 2021-07-07 NOTE — Assessment & Plan Note (Addendum)
Past history - Significant periorbital edema started 3 years ago.  Occurs handful times a year with no specific triggers noted.  Concerned about allergies.  Episodes had a quick onset and last about 1 week.  No significant pruritus associated with this. No family history of angioedema. ?Interim history - no additional episodes. Bloodwork unremarkable. ?? Monitor symptoms.  ?

## 2021-07-16 ENCOUNTER — Other Ambulatory Visit: Payer: Self-pay | Admitting: Family

## 2021-07-17 NOTE — Telephone Encounter (Signed)
Cetirizine was stopped by Dr. Selena Batten on 07/07/21 due to being ineffective.

## 2021-11-21 NOTE — Progress Notes (Deleted)
Follow Up Note  RE: Bonnie Mendoza MRN: [id number] DOB: 1980-05-08 Date of Office Visit: 11/22/2021  Referring provider: Genia Hotter, FNP Primary care provider: Stamey, Bonnie Cumins, FNP  Chief Complaint: No chief complaint on file.  History of Present Illness: I had the pleasure of seeing Bonnie Mendoza for a follow up visit at the [facility] on 11/21/2021. She is a 41 y.o. female, who is being followed for eye redness, contact dermatitis, allergic rhinitis. Her previous allergy office visit was on 07/07/2021 with Dr. Selena Mendoza. Today is a regular follow up visit.  Eye redness Past history - Noted some erythema of her conjunctiva and has been using OTC red eye drops with good benefit.  Interim history - eye exam unremarkable. Tried steroid eye drops which made it worse. Using lumify eye drops wit good benefit.  Discussed with patient that I'm not sure if her environmental allergies are contributing to her red eyes or not.  Stop zyrtec as ineffective. Concerning if patient is using personal care products and eye drops that are not on the safe list - will e-mail new safe list to patient. Limit eye drop use - most likely having some rebound symptoms. If no improvement, then will discuss allergy shots for environmental allergies.    Allergic contact dermatitis Past history - 2023 True patch test was positive to [place] of [place], Gold Sodium Thiosulfate, Nickel Sulfate, Parthenolide, Thimerosal Interim history - not really avoiding these items. Advised patient to look at safe list and only use personal care products from the safe list - pay attention to the eye drop section.    Perennial allergic rhinitis Past history - 2022 skin prick testing showed: Positive to dust mites and cockroach. 2022 bloodwork positive to cockroach and ragweed. Interim history - zyrtec ineffective.  Continue environmental control measures.  No symptoms besides the red eyes.   Periorbital edema of  both eyes Past history - Significant periorbital edema started 3 years ago.  Occurs handful times a year with no specific triggers noted.  Concerned about allergies.  Episodes had a quick onset and last about 1 week.  No significant pruritus associated with this. No family history of angioedema. Interim history - no additional episodes. Bloodwork unremarkable. Monitor symptoms.    Return in about 3 months (around 10/06/2021).  Assessment and Plan: Bonnie Mendoza is a 41 y.o. female with: No problem-specific Assessment & Plan notes found for this encounter.  No follow-ups on file.  No orders of the defined types were placed in this encounter.  Lab Orders  No laboratory test(s) ordered today    Diagnostics: Spirometry:  Tracings reviewed. Her effort: {Blank single:19197::"Good reproducible efforts.","It was hard to get consistent efforts and there is a question as to whether this reflects a maximal maneuver.","Poor effort, data can not be interpreted."} FVC: ***L FEV1: ***L, ***% predicted FEV1/FVC ratio: ***% Interpretation: {Blank single:19197::"Spirometry consistent with mild obstructive disease","Spirometry consistent with moderate obstructive disease","Spirometry consistent with severe obstructive disease","Spirometry consistent with possible restrictive disease","Spirometry consistent with mixed obstructive and restrictive disease","Spirometry uninterpretable due to technique","Spirometry consistent with normal pattern","No overt abnormalities noted given today's efforts"}.  Please see scanned spirometry results for details.  Skin Testing: {Blank single:19197::"Select foods","Environmental allergy panel","Environmental allergy panel and select foods","Food allergy panel","None","Deferred due to recent antihistamines use"}. *** Results discussed with patient/family.   Medication List:  No current outpatient medications on file.   No current facility-administered medications for this visit.    Allergies: Allergies  Allergen Reactions  .  Penicillins Anaphylaxis   I reviewed her past medical history, social history, family history, and environmental history and no significant changes have been reported from her previous visit.  Review of Systems  Constitutional:  Negative for appetite change, chills, fever and unexpected weight change.  HENT:  Negative for congestion and rhinorrhea.   Eyes:  Positive for redness. Negative for itching.  Respiratory:  Negative for cough, chest tightness, shortness of breath and wheezing.   Cardiovascular:  Negative for chest pain.  Gastrointestinal:  Negative for abdominal pain.  Genitourinary:  Negative for difficulty urinating.  Skin:  Negative for rash.  Allergic/Immunologic: Positive for environmental allergies.  Neurological:  Negative for headaches.   Objective: There were no vitals taken for this visit. There is no height or weight on file to calculate BMI. Physical Exam Vitals and nursing note reviewed.  Constitutional:      Appearance: Normal appearance. She is well-developed.  HENT:     Head: Normocephalic and atraumatic.     Right Ear: Tympanic membrane and external ear normal.     Left Ear: Tympanic membrane and external ear normal.     Nose: Nose normal.     Mouth/Throat:     Mouth: Mucous membranes are moist.     Pharynx: Oropharynx is clear.  Eyes:     Comments: Slightly erythematous conjunctiva b/l.  Cardiovascular:     Rate and Rhythm: Normal rate and regular rhythm.     Heart sounds: Normal heart sounds. No murmur heard.    No friction rub. No gallop.  Pulmonary:     Effort: Pulmonary effort is normal.     Breath sounds: Normal breath sounds. No wheezing, rhonchi or rales.  Musculoskeletal:     Cervical back: Neck supple.  Skin:    General: Skin is warm.     Findings: No rash.  Neurological:     Mental Status: She is alert and oriented to person, place, and time.  Psychiatric:        Behavior: Behavior  normal.  Previous notes and tests were reviewed. The plan was reviewed with the patient/family, and all questions/concerned were addressed.  It was my pleasure to see Bonnie Mendoza today and participate in her care. Please feel free to contact me with any questions or concerns.  Sincerely,  Wyline Mood, DO Allergy & Immunology  [facility] office: [phone] [facility] office: [phone]

## 2021-11-22 ENCOUNTER — Ambulatory Visit: Payer: BC Managed Care – PPO | Admitting: Allergy

## 2021-11-22 DIAGNOSIS — J309 Allergic rhinitis, unspecified: Secondary | ICD-10-CM

## 2021-11-22 DIAGNOSIS — J3089 Other allergic rhinitis: Secondary | ICD-10-CM

## 2021-11-22 DIAGNOSIS — L239 Allergic contact dermatitis, unspecified cause: Secondary | ICD-10-CM

## 2021-11-22 DIAGNOSIS — H5789 Other specified disorders of eye and adnexa: Secondary | ICD-10-CM

## 2022-02-08 DIAGNOSIS — R92333 Mammographic heterogeneous density, bilateral breasts: Secondary | ICD-10-CM | POA: Diagnosis not present

## 2022-02-08 DIAGNOSIS — Z1231 Encounter for screening mammogram for malignant neoplasm of breast: Secondary | ICD-10-CM | POA: Diagnosis not present

## 2022-02-09 DIAGNOSIS — Z0000 Encounter for general adult medical examination without abnormal findings: Secondary | ICD-10-CM | POA: Diagnosis not present

## 2022-02-09 DIAGNOSIS — E785 Hyperlipidemia, unspecified: Secondary | ICD-10-CM | POA: Diagnosis not present

## 2022-03-02 DIAGNOSIS — R2242 Localized swelling, mass and lump, left lower limb: Secondary | ICD-10-CM | POA: Diagnosis not present

## 2022-03-03 DIAGNOSIS — N644 Mastodynia: Secondary | ICD-10-CM | POA: Diagnosis not present

## 2022-03-03 DIAGNOSIS — L659 Nonscarring hair loss, unspecified: Secondary | ICD-10-CM | POA: Diagnosis not present

## 2022-03-03 DIAGNOSIS — N920 Excessive and frequent menstruation with regular cycle: Secondary | ICD-10-CM | POA: Diagnosis not present

## 2022-03-03 DIAGNOSIS — Z124 Encounter for screening for malignant neoplasm of cervix: Secondary | ICD-10-CM | POA: Diagnosis not present

## 2022-03-03 DIAGNOSIS — Z01419 Encounter for gynecological examination (general) (routine) without abnormal findings: Secondary | ICD-10-CM | POA: Diagnosis not present

## 2022-03-03 DIAGNOSIS — Z1151 Encounter for screening for human papillomavirus (HPV): Secondary | ICD-10-CM | POA: Diagnosis not present

## 2022-07-12 DIAGNOSIS — J3489 Other specified disorders of nose and nasal sinuses: Secondary | ICD-10-CM | POA: Diagnosis not present

## 2022-07-12 DIAGNOSIS — J342 Deviated nasal septum: Secondary | ICD-10-CM | POA: Diagnosis not present

## 2022-07-12 DIAGNOSIS — M950 Acquired deformity of nose: Secondary | ICD-10-CM | POA: Diagnosis not present

## 2022-07-12 DIAGNOSIS — J343 Hypertrophy of nasal turbinates: Secondary | ICD-10-CM | POA: Diagnosis not present

## 2022-07-13 DIAGNOSIS — J343 Hypertrophy of nasal turbinates: Secondary | ICD-10-CM | POA: Diagnosis not present

## 2022-07-13 DIAGNOSIS — J3489 Other specified disorders of nose and nasal sinuses: Secondary | ICD-10-CM | POA: Diagnosis not present

## 2022-07-13 DIAGNOSIS — J342 Deviated nasal septum: Secondary | ICD-10-CM | POA: Diagnosis not present

## 2022-07-13 DIAGNOSIS — J320 Chronic maxillary sinusitis: Secondary | ICD-10-CM | POA: Diagnosis not present

## 2023-01-03 DIAGNOSIS — J34829 Nasal valve collapse, unspecified: Secondary | ICD-10-CM | POA: Diagnosis not present

## 2023-01-03 DIAGNOSIS — J343 Hypertrophy of nasal turbinates: Secondary | ICD-10-CM | POA: Diagnosis not present

## 2023-01-03 DIAGNOSIS — J342 Deviated nasal septum: Secondary | ICD-10-CM | POA: Diagnosis not present

## 2023-01-20 IMAGING — DX DG CHEST 2V
2 series · 2 of 2 positions shown · non-contrast
Comparison: None.

CLINICAL DATA: Chest pain, burning sensation for 30 minutes

EXAM:
CHEST - 2 VIEW

[chest pa]
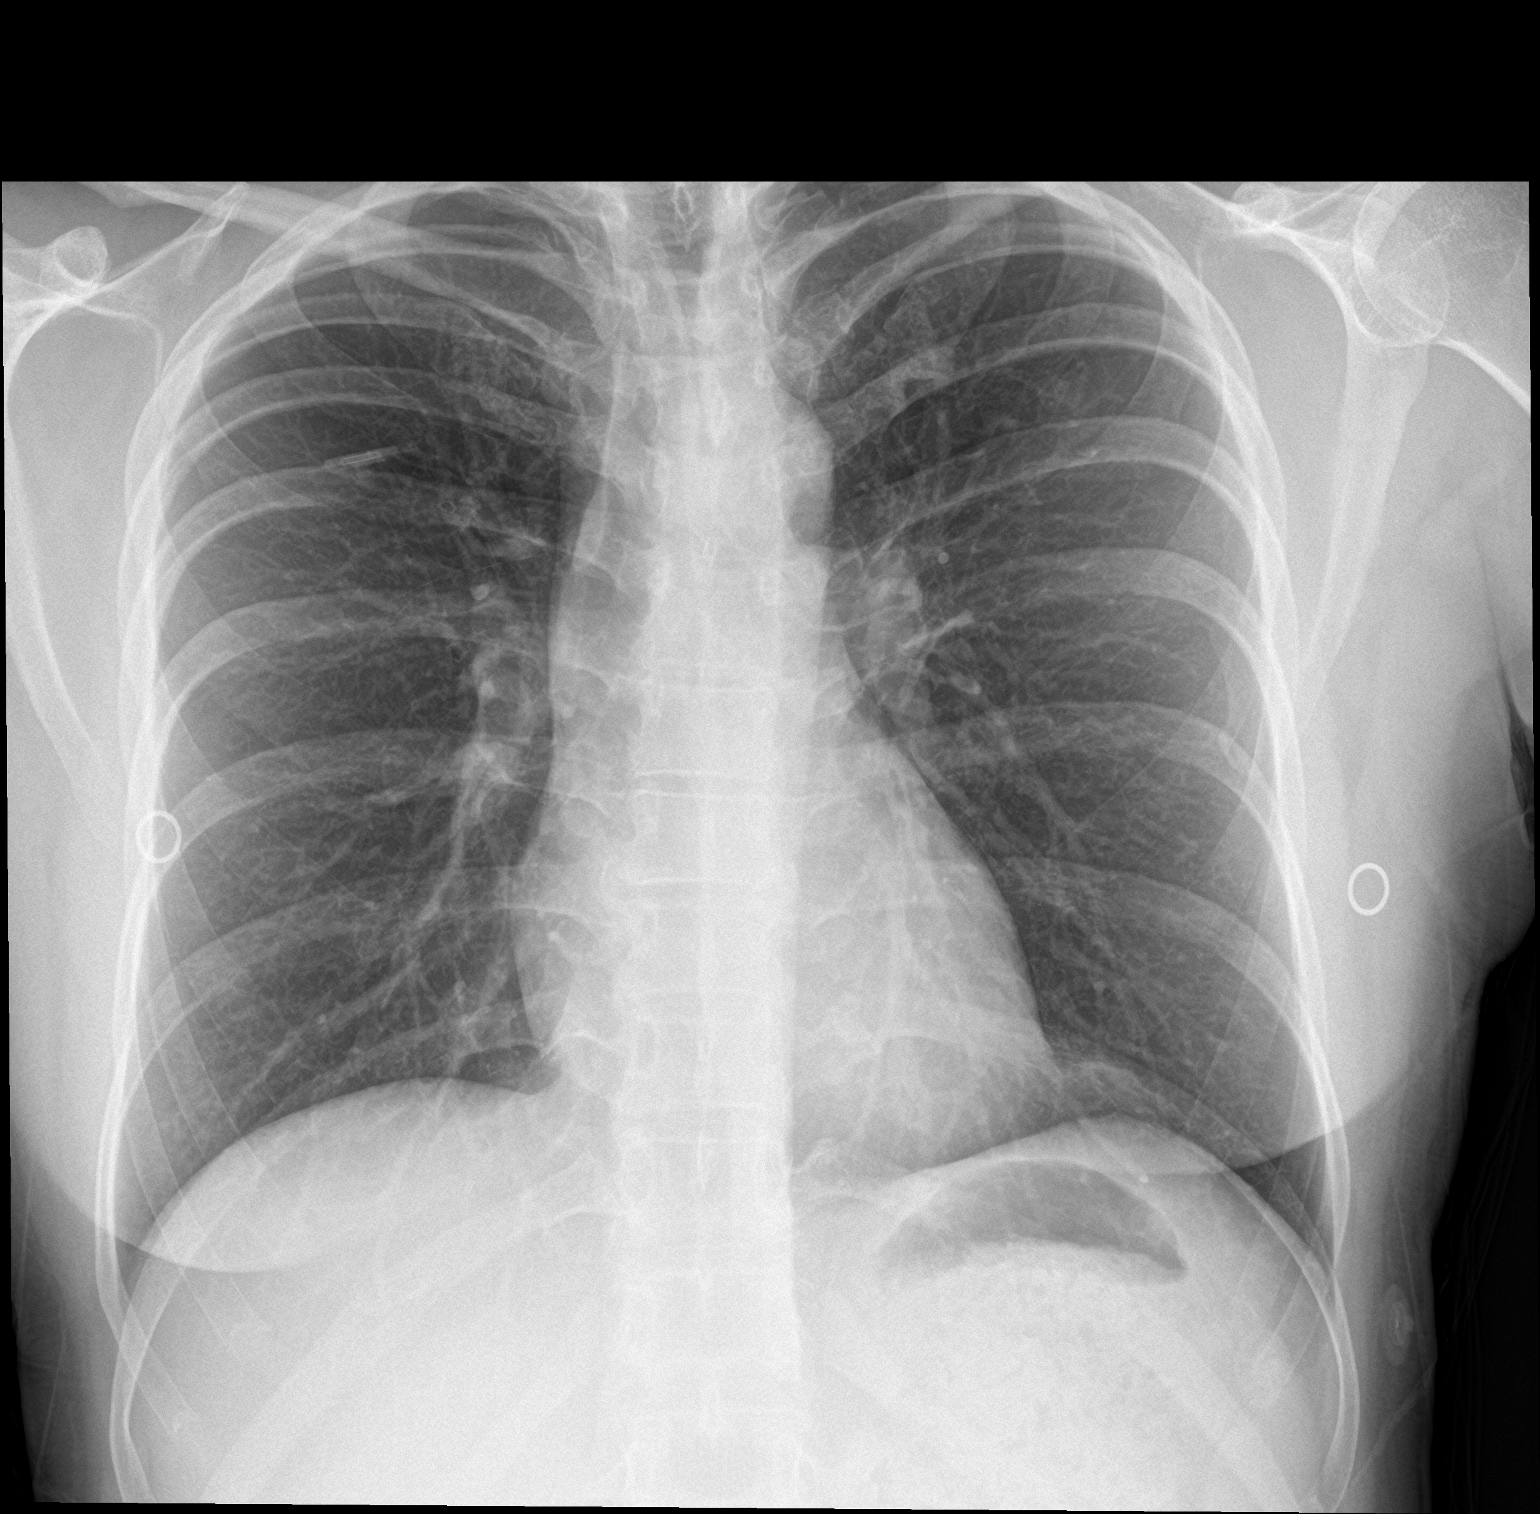

[chest lat]
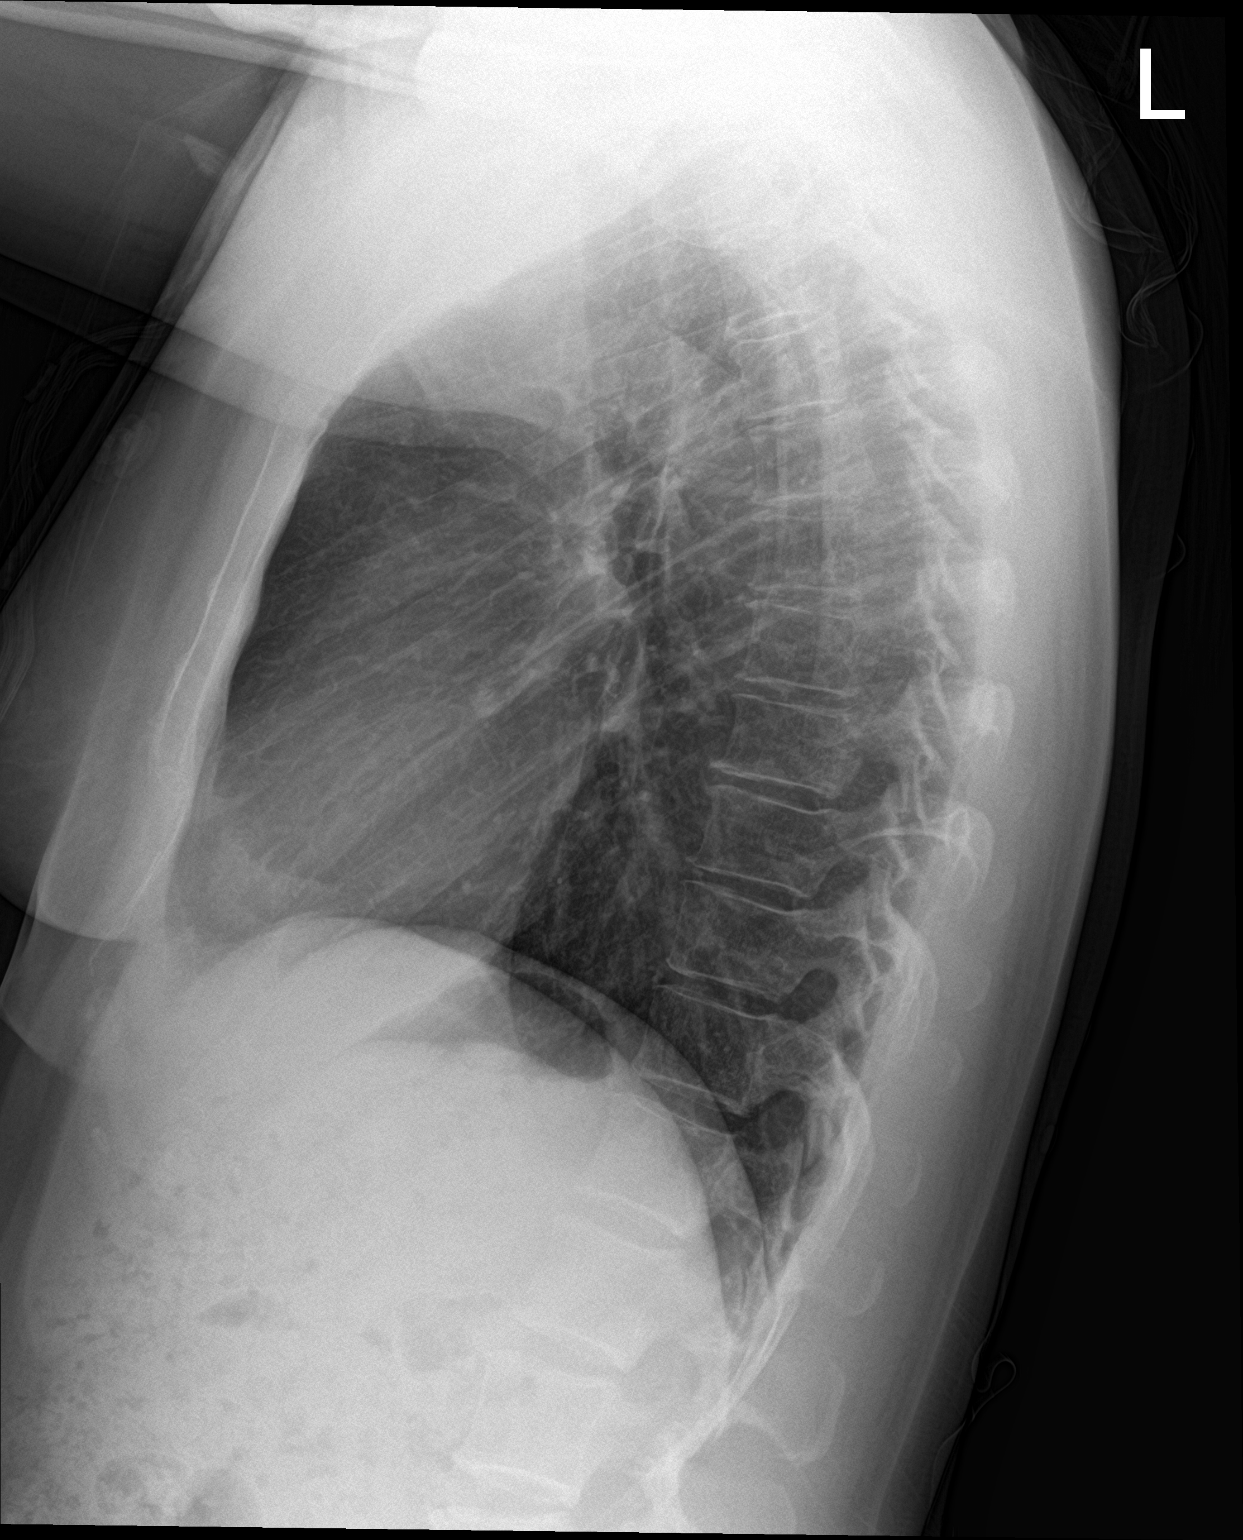

[2 of 2 positions shown; findings below may reference images not displayed]

FINDINGS: The heart size and mediastinal contours are within normal limits.
Both lungs are clear. The visualized skeletal structures are
unremarkable.
IMPRESSION: No active cardiopulmonary disease.

## 2023-01-29 DIAGNOSIS — J343 Hypertrophy of nasal turbinates: Secondary | ICD-10-CM | POA: Diagnosis not present

## 2023-01-29 DIAGNOSIS — Z87891 Personal history of nicotine dependence: Secondary | ICD-10-CM | POA: Diagnosis not present

## 2023-01-29 DIAGNOSIS — M950 Acquired deformity of nose: Secondary | ICD-10-CM | POA: Diagnosis not present

## 2023-01-29 DIAGNOSIS — J3489 Other specified disorders of nose and nasal sinuses: Secondary | ICD-10-CM | POA: Diagnosis not present

## 2023-01-29 DIAGNOSIS — J34829 Nasal valve collapse, unspecified: Secondary | ICD-10-CM | POA: Diagnosis not present

## 2023-01-29 DIAGNOSIS — Z79899 Other long term (current) drug therapy: Secondary | ICD-10-CM | POA: Diagnosis not present

## 2023-01-29 DIAGNOSIS — J342 Deviated nasal septum: Secondary | ICD-10-CM | POA: Diagnosis not present

## 2023-02-05 DIAGNOSIS — Z48810 Encounter for surgical aftercare following surgery on the sense organs: Secondary | ICD-10-CM | POA: Diagnosis not present

## 2023-02-13 DIAGNOSIS — E785 Hyperlipidemia, unspecified: Secondary | ICD-10-CM | POA: Diagnosis not present

## 2023-02-13 DIAGNOSIS — Z131 Encounter for screening for diabetes mellitus: Secondary | ICD-10-CM | POA: Diagnosis not present

## 2023-02-13 DIAGNOSIS — Z0000 Encounter for general adult medical examination without abnormal findings: Secondary | ICD-10-CM | POA: Diagnosis not present

## 2023-02-13 DIAGNOSIS — L659 Nonscarring hair loss, unspecified: Secondary | ICD-10-CM | POA: Diagnosis not present

## 2023-02-22 DIAGNOSIS — R92333 Mammographic heterogeneous density, bilateral breasts: Secondary | ICD-10-CM | POA: Diagnosis not present

## 2023-02-22 DIAGNOSIS — Z1231 Encounter for screening mammogram for malignant neoplasm of breast: Secondary | ICD-10-CM | POA: Diagnosis not present

## 2023-03-07 DIAGNOSIS — B9689 Other specified bacterial agents as the cause of diseases classified elsewhere: Secondary | ICD-10-CM | POA: Diagnosis not present

## 2023-03-07 DIAGNOSIS — N390 Urinary tract infection, site not specified: Secondary | ICD-10-CM | POA: Diagnosis not present

## 2023-03-07 DIAGNOSIS — R300 Dysuria: Secondary | ICD-10-CM | POA: Diagnosis not present

## 2023-03-07 DIAGNOSIS — Z3202 Encounter for pregnancy test, result negative: Secondary | ICD-10-CM | POA: Diagnosis not present

## 2023-03-09 DIAGNOSIS — Z01419 Encounter for gynecological examination (general) (routine) without abnormal findings: Secondary | ICD-10-CM | POA: Diagnosis not present

## 2023-03-09 DIAGNOSIS — N951 Menopausal and female climacteric states: Secondary | ICD-10-CM | POA: Diagnosis not present

## 2023-03-09 DIAGNOSIS — N644 Mastodynia: Secondary | ICD-10-CM | POA: Diagnosis not present

## 2023-03-09 DIAGNOSIS — L659 Nonscarring hair loss, unspecified: Secondary | ICD-10-CM | POA: Diagnosis not present

## 2023-03-17 DIAGNOSIS — N390 Urinary tract infection, site not specified: Secondary | ICD-10-CM | POA: Diagnosis not present

## 2023-03-17 DIAGNOSIS — B9689 Other specified bacterial agents as the cause of diseases classified elsewhere: Secondary | ICD-10-CM | POA: Diagnosis not present

## 2023-03-18 DIAGNOSIS — R309 Painful micturition, unspecified: Secondary | ICD-10-CM | POA: Diagnosis not present

## 2023-03-18 DIAGNOSIS — A499 Bacterial infection, unspecified: Secondary | ICD-10-CM | POA: Diagnosis not present

## 2023-03-18 DIAGNOSIS — N390 Urinary tract infection, site not specified: Secondary | ICD-10-CM | POA: Diagnosis not present

## 2023-03-19 ENCOUNTER — Other Ambulatory Visit: Payer: Self-pay

## 2023-03-19 ENCOUNTER — Emergency Department (HOSPITAL_BASED_OUTPATIENT_CLINIC_OR_DEPARTMENT_OTHER)
Admission: EM | Admit: 2023-03-19 | Discharge: 2023-03-20 | Disposition: A | Discharge status: Home / Self Care | Payer: BC Managed Care – PPO | Attending: Emergency Medicine | Admitting: Emergency Medicine

## 2023-03-19 ENCOUNTER — Emergency Department (HOSPITAL_BASED_OUTPATIENT_CLINIC_OR_DEPARTMENT_OTHER): Payer: BC Managed Care – PPO

## 2023-03-19 DIAGNOSIS — R109 Unspecified abdominal pain: Secondary | ICD-10-CM | POA: Diagnosis not present

## 2023-03-19 DIAGNOSIS — R300 Dysuria: Secondary | ICD-10-CM | POA: Insufficient documentation

## 2023-03-19 LAB — URINALYSIS, ROUTINE W REFLEX MICROSCOPIC
Bacteria, UA: NONE SEEN
Bilirubin Urine: NEGATIVE
Glucose, UA: NEGATIVE mg/dL
Hgb urine dipstick: NEGATIVE
Ketones, ur: NEGATIVE mg/dL
Nitrite: NEGATIVE
Protein, ur: NEGATIVE mg/dL
Specific Gravity, Urine: 1.006 (ref 1.005–1.030)
pH: 6 (ref 5.0–8.0)

## 2023-03-19 LAB — CBC
HCT: 37 % (ref 36.0–46.0)
Hemoglobin: 12.5 g/dL (ref 12.0–15.0)
MCH: 29.3 pg (ref 26.0–34.0)
MCHC: 33.8 g/dL (ref 30.0–36.0)
MCV: 86.9 fL (ref 80.0–100.0)
Platelets: 294 10*3/uL (ref 150–400)
RBC: 4.26 MIL/uL (ref 3.87–5.11)
RDW: 12.7 % (ref 11.5–15.5)
WBC: 6.1 10*3/uL (ref 4.0–10.5)
nRBC: 0 % (ref 0.0–0.2)

## 2023-03-19 LAB — BASIC METABOLIC PANEL
Anion gap: 8 (ref 5–15)
BUN: 11 mg/dL (ref 6–20)
CO2: 24 mmol/L (ref 22–32)
Calcium: 9.4 mg/dL (ref 8.9–10.3)
Chloride: 106 mmol/L (ref 98–111)
Creatinine, Ser: 0.66 mg/dL (ref 0.44–1.00)
GFR, Estimated: >60 mL/min (ref >60)
Glucose, Bld: 111 mg/dL — ABNORMAL HIGH (ref 70–99)
Potassium: 4.3 mmol/L (ref 3.5–5.1)
Sodium: 138 mmol/L (ref 135–145)

## 2023-03-19 LAB — PREGNANCY, URINE: Preg Test, Ur: NEGATIVE

## 2023-03-19 MED ORDER — PHENAZOPYRIDINE HCL 200 MG PO TABS: 200.0000 mg | ORAL_TABLET | Freq: Three times a day (TID) | ORAL | 0 refills | Status: AC

## 2023-03-19 NOTE — ED Triage Notes (Signed)
UTI symptoms starting about 3 weeks ago. Was given antibiotics from UC. Seen for follow up and still had leukocytes- antibiotics changed and told if symptoms persist past 4 days to go to ER. This is day 4.

## 2023-03-19 NOTE — ED Notes (Signed)
Pt transported to CT via wheelchair.

## 2023-03-19 NOTE — ED Provider Notes (Signed)
[place] EMERGENCY DEPARTMENT AT [facility] Provider Note   CSN: [id number] Arrival date & time: 03/19/23  1920     History  Chief Complaint  Patient presents with   Dysuria   Flank Pain    Bonnie Mendoza is a 42 y.o. female.   Dysuria Associated symptoms: flank pain   Flank Pain  Patient with dysuria and flank pain.  Around 3 weeks ago was treated for UTI.  Was given Bactrim initially and then another 2-day course with continued symptoms.  Serratia had grown out but was susceptible to Bactrim.  Seen on the 14th of the urgent care for continued symptoms.  Started on Cipro.  Culture reportedly sent but is not resulted.  Now having some flank pain.  Continued dysuria.  No fevers.  No nausea or vomiting.    No past medical history on file.  Home Medications Prior to Admission medications   Not on File      Allergies    Penicillins    Review of Systems   Review of Systems  Genitourinary:  Positive for dysuria and flank pain.    Physical Exam Updated Vital Signs BP 116/78 (BP Location: Right Arm)   Pulse 72   Temp 98.3 F (36.8 C) (Oral)   Resp 17   Ht 5\' 5"  (1.651 m)   Wt 71.2 kg   SpO2 96%   BMI 26.13 kg/m  Physical Exam Vitals reviewed.  HENT:     Head: Normocephalic.  Cardiovascular:     Rate and Rhythm: Regular rhythm.  Chest:     Chest wall: No tenderness.  Abdominal:     Tenderness: There is no abdominal tenderness.  Genitourinary:    Comments: No CVA tenderness Musculoskeletal:     Cervical back: Neck supple.  Skin:    General: Skin is warm.  Neurological:     Mental Status: She is alert and oriented to person, place, and time.     ED Results / Procedures / Treatments   Labs (all labs ordered are listed, but only abnormal results are displayed) Labs Reviewed  URINALYSIS, ROUTINE W REFLEX MICROSCOPIC - Abnormal; Notable for the following components:      Result Value   Leukocytes,Ua SMALL (*)    All other components  within normal limits  BASIC METABOLIC PANEL - Abnormal; Notable for the following components:   Glucose, Bld 111 (*)    All other components within normal limits  PREGNANCY, URINE  CBC    EKG None  Radiology No results found.  Procedures Procedures    Medications Ordered in ED Medications - No data to display  ED Course/ Medical Decision Making/ A&P                                 Medical Decision Making Amount and/or Complexity of Data Reviewed Labs: ordered. Radiology: ordered.   Patient with dysuria.  Has had for around 3 weeks now.  Did have previous Serratia.  Told to come in due to potential need for IV medicines.  Urinalysis reassuring here.  Does have some leukocyte and some white cells but no bacteria.  White count reassuring.  However with continued pain going to flank does worry for cause such as kidney stone.  Will get CT stone protocol to evaluate.  CT scan pending.  Care turned over to Dr. Nicanor Alcon        Final Clinical Impression(s) / ED  Diagnoses Final diagnoses:  Dysuria    Rx / DC Orders ED Discharge Orders     None         Benjiman Core, MD 03/19/23 2338

## 2023-03-20 NOTE — ED Notes (Signed)
Dr. Palumbo at bedside. 

## 2023-03-20 NOTE — ED Notes (Addendum)
Pt requesting to speak to the doctor about CT results and discharge. Dr Rubin Payor is no longer in ED, Dr Nicanor Alcon informed and asked to speak with the pt.

## 2023-03-21 LAB — URINE CULTURE: Culture: NO GROWTH

## 2023-04-08 DIAGNOSIS — R300 Dysuria: Secondary | ICD-10-CM | POA: Diagnosis not present

## 2023-11-23 DIAGNOSIS — R200 Anesthesia of skin: Secondary | ICD-10-CM | POA: Diagnosis not present

## 2024-01-22 DIAGNOSIS — R92333 Mammographic heterogeneous density, bilateral breasts: Secondary | ICD-10-CM | POA: Diagnosis not present

## 2024-01-22 DIAGNOSIS — Z1231 Encounter for screening mammogram for malignant neoplasm of breast: Secondary | ICD-10-CM | POA: Diagnosis not present

## 2024-02-21 DIAGNOSIS — Z131 Encounter for screening for diabetes mellitus: Secondary | ICD-10-CM | POA: Diagnosis not present

## 2024-02-21 DIAGNOSIS — E785 Hyperlipidemia, unspecified: Secondary | ICD-10-CM | POA: Diagnosis not present

## 2024-02-21 DIAGNOSIS — Z0000 Encounter for general adult medical examination without abnormal findings: Secondary | ICD-10-CM | POA: Diagnosis not present

## 2024-02-21 DIAGNOSIS — L659 Nonscarring hair loss, unspecified: Secondary | ICD-10-CM | POA: Diagnosis not present

## 2024-02-21 DIAGNOSIS — Z124 Encounter for screening for malignant neoplasm of cervix: Secondary | ICD-10-CM | POA: Diagnosis not present

## 2024-02-21 DIAGNOSIS — R000 Tachycardia, unspecified: Secondary | ICD-10-CM | POA: Diagnosis not present

## 2024-03-14 DIAGNOSIS — Z1151 Encounter for screening for human papillomavirus (HPV): Secondary | ICD-10-CM | POA: Diagnosis not present

## 2024-03-14 DIAGNOSIS — Z124 Encounter for screening for malignant neoplasm of cervix: Secondary | ICD-10-CM | POA: Diagnosis not present

## 2024-03-14 DIAGNOSIS — Z01419 Encounter for gynecological examination (general) (routine) without abnormal findings: Secondary | ICD-10-CM | POA: Diagnosis not present
